# Patient Record
Sex: Female | Born: 1990 | Race: White | Hispanic: No | Marital: Single | Smoking: Current every day smoker
Health system: Southern US, Community
[De-identification: ages and names within clinical notes are randomized; demographics above are authoritative.]

## PROBLEM LIST (undated history)

## (undated) HISTORY — PX: NO PAST SURGERIES: SHX2092

---

## 2012-03-20 ENCOUNTER — Emergency Department: Payer: Self-pay | Admitting: Emergency Medicine

## 2012-03-28 ENCOUNTER — Emergency Department: Payer: Self-pay | Admitting: *Deleted

## 2012-03-28 LAB — COMPREHENSIVE METABOLIC PANEL
Albumin: 4 g/dL (ref 3.8–5.6)
Anion Gap: 8 (ref 7–16)
BUN: 8 mg/dL — ABNORMAL LOW (ref 9–21)
Chloride: 109 mmol/L — ABNORMAL HIGH (ref 97–107)
Co2: 26 mmol/L — ABNORMAL HIGH (ref 16–25)
Creatinine: 1.16 mg/dL (ref 0.60–1.30)
EGFR (African American): >60
EGFR (Non-African Amer.): >60
SGOT(AST): 24 U/L (ref 0–26)
Total Protein: 7.5 g/dL (ref 6.4–8.6)

## 2012-03-28 LAB — URINALYSIS, COMPLETE
Blood: NEGATIVE
Glucose,UR: NEGATIVE mg/dL (ref 0–75)
Leukocyte Esterase: NEGATIVE
Nitrite: NEGATIVE
Ph: 6 (ref 4.5–8.0)
Protein: NEGATIVE
Specific Gravity: 1.021 (ref 1.003–1.030)
WBC UR: 1 /HPF (ref 0–5)

## 2012-03-28 LAB — CBC
HGB: 14.3 g/dL (ref 12.0–16.0)
MCHC: 33.5 g/dL (ref 32.0–36.0)
MCV: 85 fL (ref 80–100)
Platelet: 255 10*3/uL (ref 150–440)
RBC: 5.01 10*6/uL (ref 3.80–5.20)
RDW: 13.6 % (ref 11.5–14.5)

## 2012-03-28 LAB — PREGNANCY, URINE: Pregnancy Test, Urine: NEGATIVE m[IU]/mL

## 2012-03-31 ENCOUNTER — Emergency Department: Payer: Self-pay | Admitting: *Deleted

## 2012-04-01 LAB — COMPREHENSIVE METABOLIC PANEL
Albumin: 4.2 g/dL (ref 3.8–5.6)
Alkaline Phosphatase: 94 U/L (ref 82–169)
Anion Gap: 6 — ABNORMAL LOW (ref 7–16)
BUN: 11 mg/dL (ref 9–21)
Bilirubin,Total: 0.3 mg/dL (ref 0.2–1.0)
Calcium, Total: 8.9 mg/dL — ABNORMAL LOW (ref 9.0–10.7)
Co2: 30 mmol/L — ABNORMAL HIGH (ref 16–25)
Creatinine: 1.12 mg/dL (ref 0.60–1.30)
EGFR (Non-African Amer.): >60
Potassium: 4.1 mmol/L (ref 3.3–4.7)
SGOT(AST): 21 U/L (ref 0–26)
Sodium: 142 mmol/L — ABNORMAL HIGH (ref 132–141)
Total Protein: 7.5 g/dL (ref 6.4–8.6)

## 2012-04-01 LAB — URINALYSIS, COMPLETE
Bacteria: NONE SEEN
Bilirubin,UR: NEGATIVE
Ketone: NEGATIVE
Ph: 8 (ref 4.5–8.0)
Protein: NEGATIVE
RBC,UR: 1 /HPF (ref 0–5)
Specific Gravity: 1.021 (ref 1.003–1.030)

## 2012-04-01 LAB — CBC
HCT: 42.6 % (ref 35.0–47.0)
MCHC: 32.9 g/dL (ref 32.0–36.0)
MCV: 86 fL (ref 80–100)
Platelet: 238 10*3/uL (ref 150–440)

## 2012-04-01 LAB — PREGNANCY, URINE: Pregnancy Test, Urine: NEGATIVE m[IU]/mL

## 2012-12-17 ENCOUNTER — Emergency Department: Payer: Self-pay | Admitting: Emergency Medicine

## 2012-12-17 LAB — URINALYSIS, COMPLETE
Glucose,UR: NEGATIVE mg/dL (ref 0–75)
Ketone: NEGATIVE
Leukocyte Esterase: NEGATIVE
Ph: 6 (ref 4.5–8.0)
Protein: 30
RBC,UR: 2 /HPF (ref 0–5)
Squamous Epithelial: 9

## 2012-12-17 LAB — CBC
HCT: 41.2 % (ref 35.0–47.0)
HGB: 14.1 g/dL (ref 12.0–16.0)
MCH: 29.3 pg (ref 26.0–34.0)
MCHC: 34.2 g/dL (ref 32.0–36.0)
MCV: 86 fL (ref 80–100)
Platelet: 240 10*3/uL (ref 150–440)
RDW: 14 % (ref 11.5–14.5)
WBC: 6.9 10*3/uL (ref 3.6–11.0)

## 2012-12-17 LAB — WET PREP, GENITAL

## 2012-12-19 ENCOUNTER — Emergency Department: Payer: Self-pay | Admitting: Unknown Physician Specialty

## 2012-12-19 LAB — HCG, QUANTITATIVE, PREGNANCY: Beta Hcg, Quant.: 23 m[IU]/mL — ABNORMAL HIGH

## 2013-01-27 ENCOUNTER — Emergency Department: Payer: Self-pay | Admitting: Emergency Medicine

## 2013-01-30 ENCOUNTER — Emergency Department: Payer: Self-pay | Admitting: Emergency Medicine

## 2014-04-04 IMAGING — US US OB < 14 WEEKS - US OB TV
1 series · 14 of 28 positions shown · non-contrast
Comparison: none

REASON FOR EXAM: preg, c ramping, bleeding
COMMENTS:

[Series 1: us ob < 14 weeks - us ob tv · 0.25mm/px · 14 of 65 slices shown]
[im 3/65]
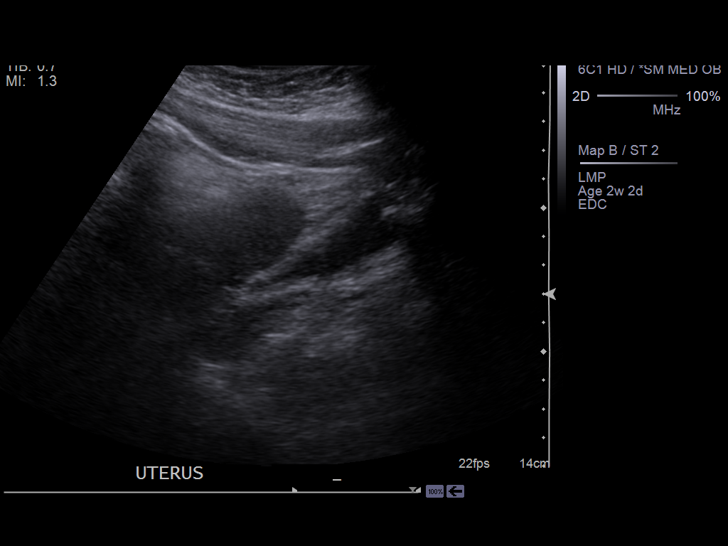
[im 8/65]
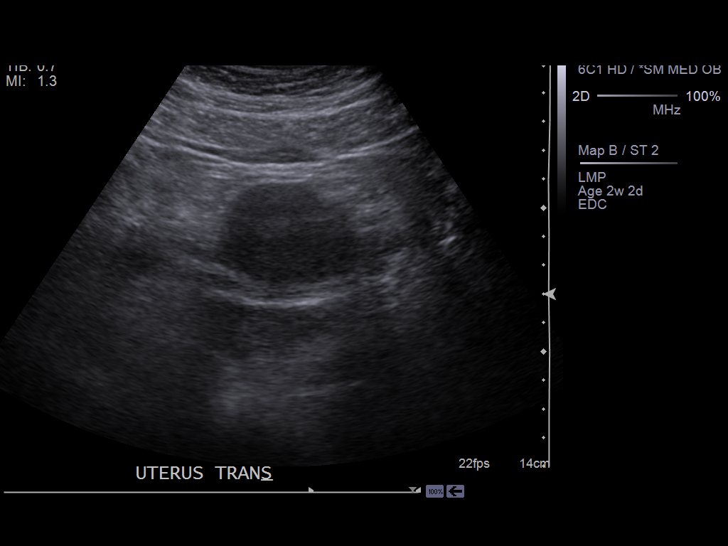
[im 12/65]
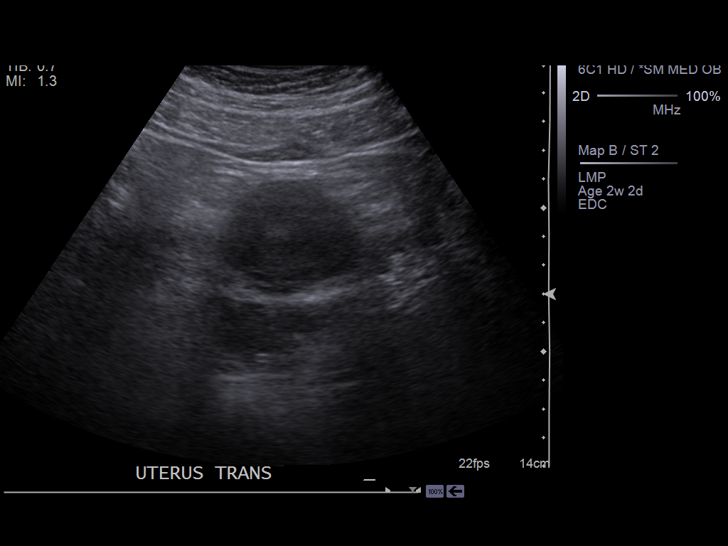
[im 17/65]
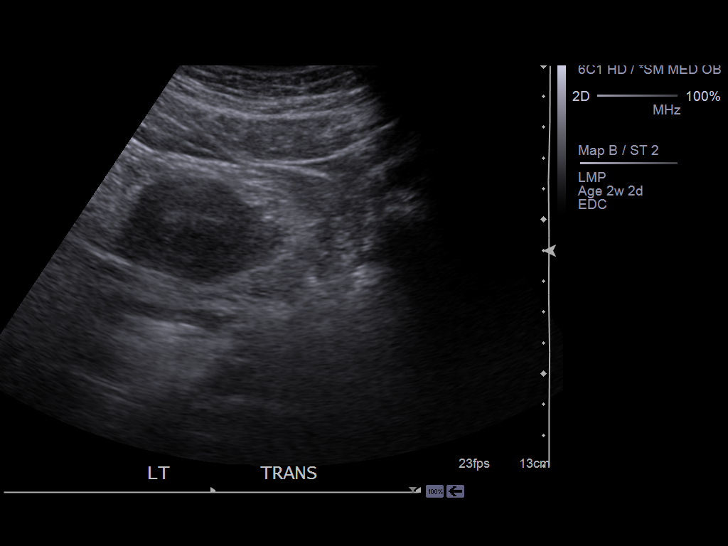
[im 22/65]
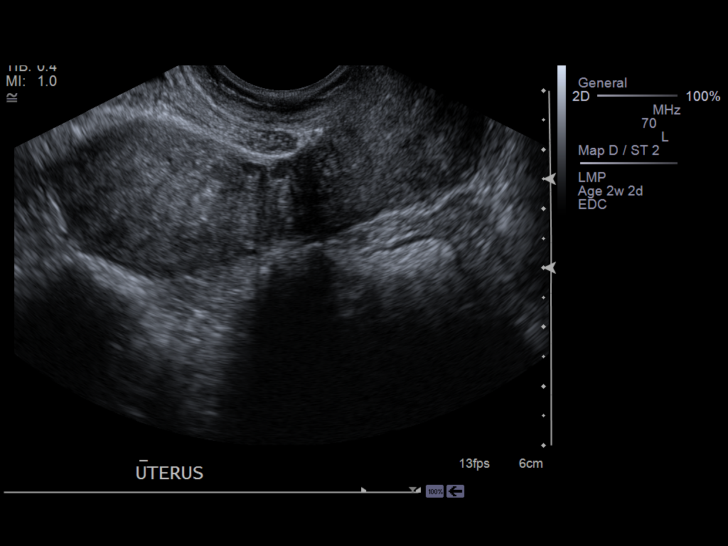
[im 27/65]
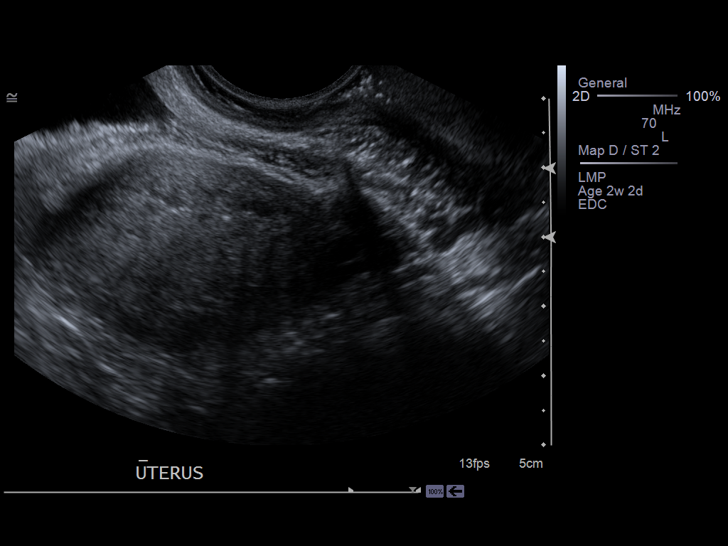
[im 31/65]
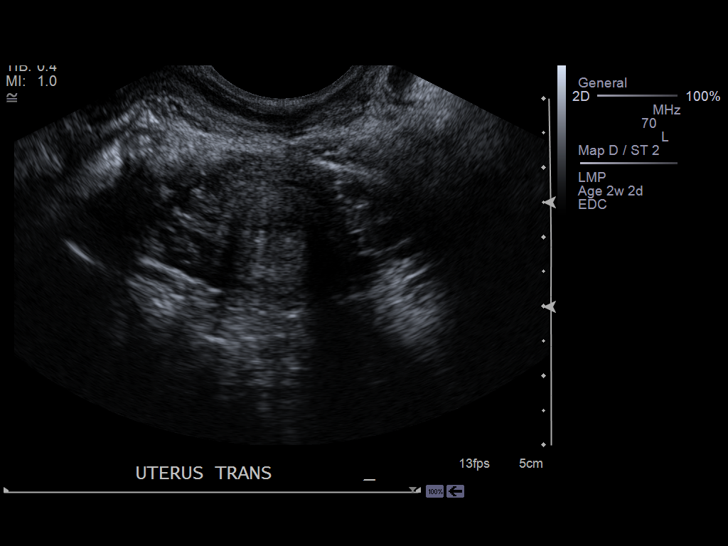
[im 36/65]
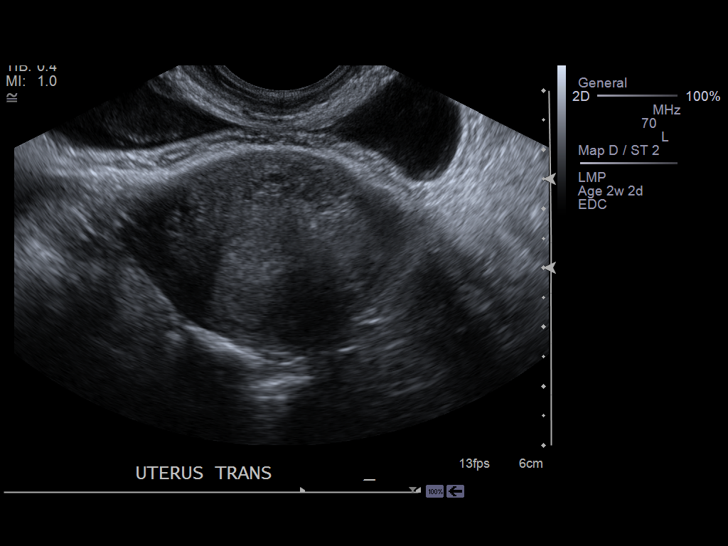
[im 41/65]
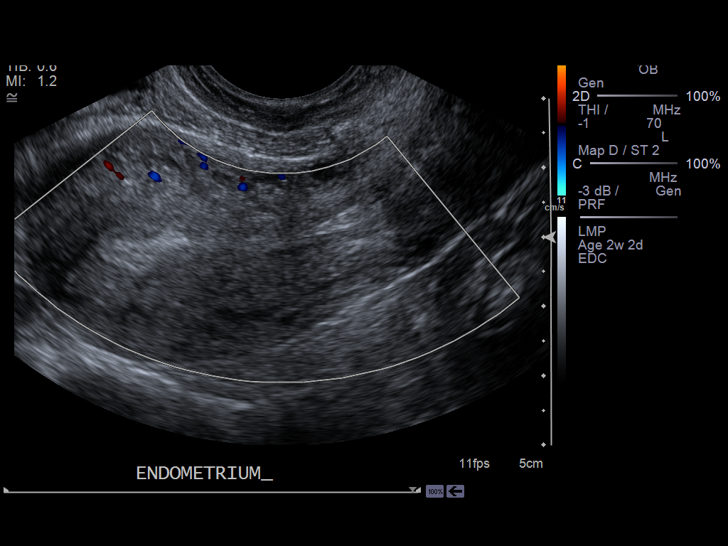
[im 46/65]
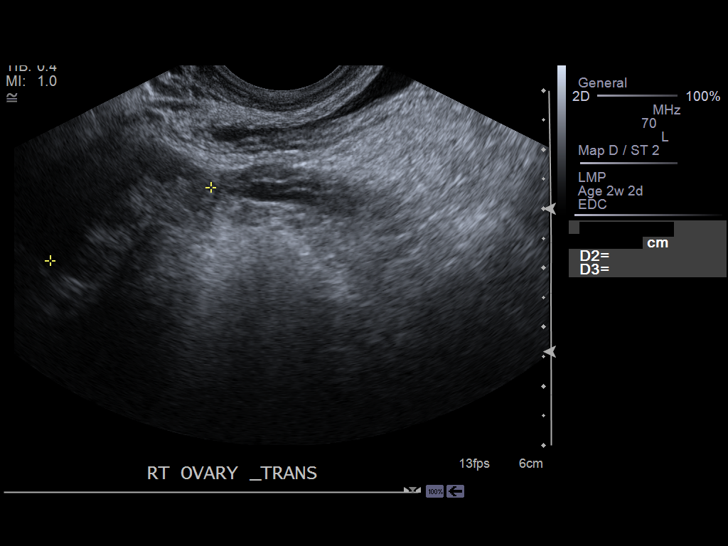
[im 50/65]
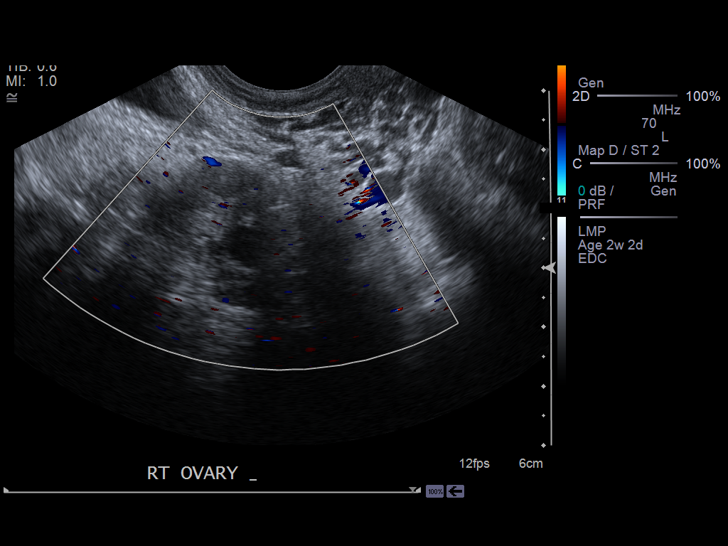
[im 55/65]
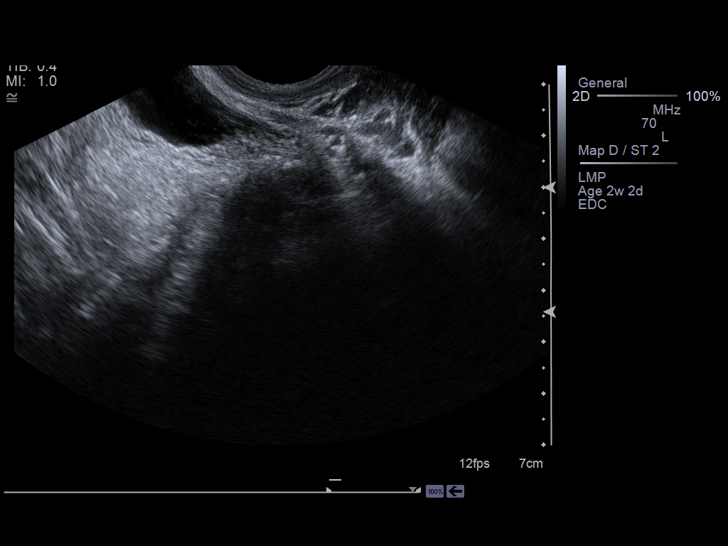
[im 60/65]
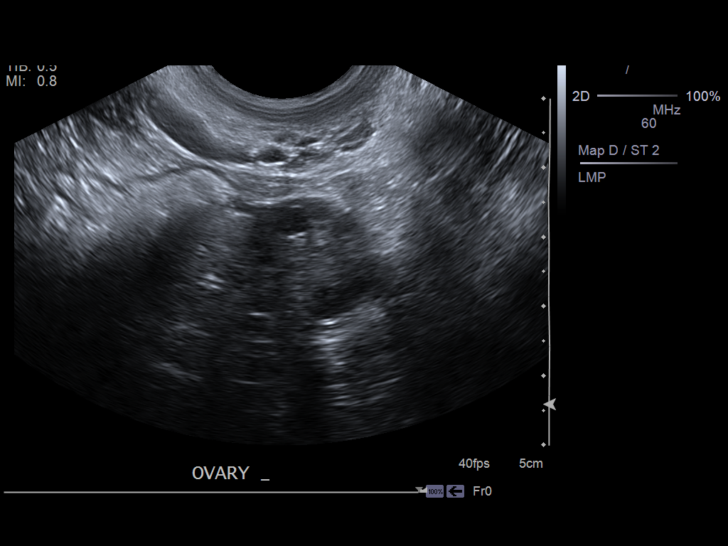
[im 65/65]
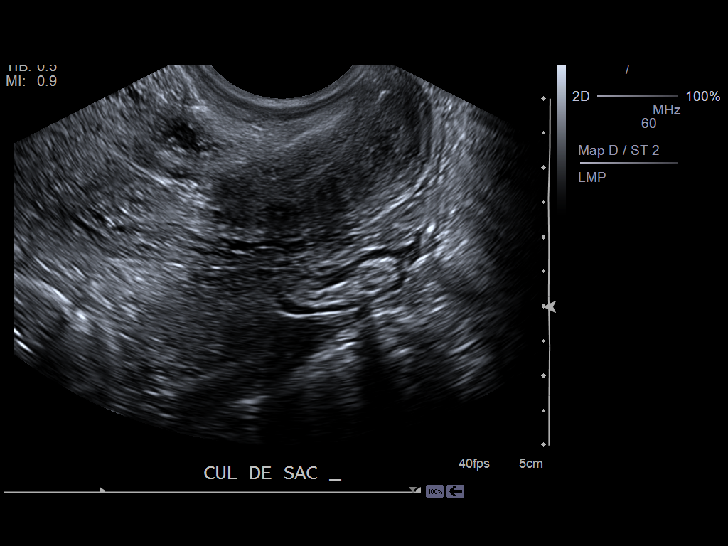

[14 of 28 positions shown; findings below may reference images not displayed]

PROCEDURE:     US  - US OB LESS THAN 14 WEEKS/W TRANS  - December 17, 2012 [DATE]

RESULT:     Transabdominal and transvaginal techniques were employed to
evaluate the pelvis.

The uterus measures 7.7 x 3.6 x 4.9 cm and exhibits normal echotexture. The
endometrial stripe measures 7.5 mm. No gestational sac or fluid in the
uterine cavity is demonstrated. There is no free fluid in the cul-de-sac.

The ovaries are normal in echotexture and vascularity. The right ovary
measures 3.3 x 2 x 3 centimeters the left ovary measures 2.2 x 1.9 x 1.4 cm.
No cystic or solid adnexal mass is demonstrated.
IMPRESSION: 1. There is no evidence of an IUP nor of an an ectopic pregnancy.
2. The appearance of the uterus and ovaries is within the limits of normal.

Serial beta-hCG determinations and followup ultrasound examinations may be
indicated.

[REDACTED]

## 2014-09-14 ENCOUNTER — Observation Stay: Payer: Self-pay

## 2014-09-15 ENCOUNTER — Inpatient Hospital Stay: Payer: Self-pay

## 2014-09-15 LAB — CBC WITH DIFFERENTIAL/PLATELET
BASOS ABS: 0 10*3/uL (ref 0.0–0.1)
BASOS PCT: 0.3 %
EOS PCT: 0.5 %
Eosinophil #: 0.1 10*3/uL (ref 0.0–0.7)
HCT: 38.9 % (ref 35.0–47.0)
HGB: 13 g/dL (ref 12.0–16.0)
Lymphocyte #: 0.9 10*3/uL — ABNORMAL LOW (ref 1.0–3.6)
Lymphocyte %: 6.5 %
MCH: 29.3 pg (ref 26.0–34.0)
MCHC: 33.4 g/dL (ref 32.0–36.0)
MCV: 88 fL (ref 80–100)
MONO ABS: 1.4 x10 3/mm — AB (ref 0.2–0.9)
MONOS PCT: 9.7 %
NEUTROS ABS: 11.7 10*3/uL — AB (ref 1.4–6.5)
Neutrophil %: 83 %
PLATELETS: 159 10*3/uL (ref 150–440)
RBC: 4.44 10*6/uL (ref 3.80–5.20)
RDW: 13.7 % (ref 11.5–14.5)
WBC: 14.1 10*3/uL — AB (ref 3.6–11.0)

## 2014-09-15 LAB — GC/CHLAMYDIA PROBE AMP

## 2014-09-18 LAB — HEMATOCRIT: HCT: 34.2 % — ABNORMAL LOW (ref 35.0–47.0)

## 2015-02-14 NOTE — H&P (Signed)
L&D Evaluation:  History:  HPI 24 year old G2 P0010 with EDC=09/07/2014 by LMP=12/01/2013 presents for a NST and AFI at [redacted] weeks gestation. She was scheduled for an IOL today, which had to be postponed till tomorrow due to busy L&D unit. Baby has been moving well and has felt some mild irregular ctxs. PNC begun in [place] then transferred to [facility] at 34 weeks. Her PNC course remarkable for Trichimonas, obesity, and receiving Rhogam 08/05/2014.  LABS : A negative, RI, VNI,GBS negative.   Presents with postdates for antepartum testing   Patient's Medical History Fx right arm   Patient's Surgical History none   Medications Pre Natal Vitamins   Allergies ASA   Social History tobacco  former   ROS:  ROS see HPI   Exam:  Vital Signs stable  128/80   General no apparent distress   Mental Status clear   Abdomen gravid, non-tender   Estimated Fetal Weight Average for gestational age   Fetal Position cephalic   Mebranes Intact, AFI=11.28cm   FHT normal rate with no decels, 140s with accels to 170s to 180   Ucx irregular, mild   Impression:  Impression IUP at 41 weeks with reactive NST and normal AFI   Plan:  Plan DC home . Call in AM at 0800 for bed availability. Plan Cervidil in AM.   Electronic Signatures: Trinna BalloonGutierrez, Arcadia Gorgas L (CNM)  (Signed 09-Dec-15 23:58)  Authored: L&D Evaluation   Last Updated: 09-Dec-15 23:58 by Trinna BalloonGutierrez, Madie Cahn L (CNM)

## 2015-02-14 NOTE — H&P (Signed)
L&D Evaluation:  History Expanded:  HPI 24 year old G2 P0010 with EDC=09/07/2014 by LMP=12/01/2013 presents for IOL for postterm pregnancy. AFI and NST were Normal last night. Denies regular ctx, LOF, VB or decreased FM. PNC begun in [place] then transferred to [facility] at 34 weeks. Her PNC course remarkable for Trichimonas with negative TOC, obesity, and receiving Rhogam 08/05/2014.  LABS : A negative, RI, VNI,GBS negative.   Blood Type (Maternal) A negative   Group B Strep Results Maternal (Result >5wks must be treated as unknown) negative   Maternal HIV Negative   Maternal Syphilis Ab Nonreactive   Maternal Varicella Non-Immune   Rubella Results (Maternal) immune   Presents with postdates IOL   Patient's Medical History Fx right arm, Trich   Patient's Surgical History none   Medications Pre Natal Vitamins   Allergies ASA (pt's father is allergic)   Social History tobacco  former   ROS:  ROS see HPI   Exam:  Vital Signs stable   General no apparent distress   Mental Status clear   Abdomen gravid, non-tender   Estimated Fetal Weight Average for gestational age   Fetal Position cephalic   Pelvic no external lesions, 1.5/80/0   Mebranes Intact   FHT normal rate with no decels, baseline 135, mod variability, + accels   Impression:  Impression IOL   Plan:  Comments Reviewed risks of IOL including tachysystole, fetal intolerance to labor, or need for cesarean delivery. Pt agrees to plan for IOL. RN to place cervidil intravaginally.   Electronic Signatures: Kharee Lesesne, Marta Lamasamara K (CNM)  (Signed 10-Dec-15 11:36)  Authored: L&D Evaluation   Last Updated: 10-Dec-15 11:36 by Vella KohlerBrothers, Sohrab Keelan K (CNM)

## 2015-05-26 DIAGNOSIS — R079 Chest pain, unspecified: Secondary | ICD-10-CM | POA: Insufficient documentation

## 2015-05-27 ENCOUNTER — Emergency Department: Payer: Medicaid Other

## 2015-05-27 ENCOUNTER — Other Ambulatory Visit: Payer: Self-pay

## 2015-05-27 ENCOUNTER — Encounter: Payer: Self-pay | Admitting: Emergency Medicine

## 2015-05-27 ENCOUNTER — Emergency Department
Admission: EM | Admit: 2015-05-27 | Discharge: 2015-05-27 | Disposition: A | Discharge status: Home / Self Care | Payer: Medicaid Other | Attending: Emergency Medicine | Admitting: Emergency Medicine

## 2015-05-27 DIAGNOSIS — R079 Chest pain, unspecified: Secondary | ICD-10-CM

## 2015-05-27 NOTE — Discharge Instructions (Signed)

## 2015-05-27 NOTE — ED Provider Notes (Signed)
[facility] Emergency Department Provider Note  ____________________________________________  Time seen: Approximately 225 AM  I have reviewed the triage vital signs and the nursing notes.   HISTORY  Chief Complaint Pleurisy    HPI Erin Levine is a 24 y.o. female without any pertinent medical problems who presents with upper chest pain over the past 3 days. 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 Ever since then she's been having aching chest pain was nonradiating to the upper chest. She says it is worse with deep breathing. No associated shortness of breath, nausea vomiting or diaphoresis. Has a progestin only implanted birth control.   History reviewed. No pertinent past medical history.  There are no active problems to display for this patient.   History reviewed. No pertinent past surgical history.  No current outpatient prescriptions on file.  Allergies Review of patient's allergies indicates no known allergies.  No family history on file.  Social History Social History  Substance Use Topics  . Smoking status: Unknown If Ever Smoked  . Smokeless tobacco: None  . Alcohol Use: No    Review of Systems Constitutional: No fever/chills Eyes: No visual changes. ENT: No sore throat. Cardiovascular: Denies chest pain. Respiratory: Denies shortness of breath. Gastrointestinal: No abdominal pain.  No nausea, no vomiting.  No diarrhea.  No constipation. Genitourinary: Negative for dysuria. Musculoskeletal: Negative for back pain. Skin: Negative for rash. Neurological: Negative for headaches, focal weakness or numbness.  10-point ROS otherwise negative.  ____________________________________________   PHYSICAL EXAM:  VITAL SIGNS: ED Triage Vitals  Enc Vitals Group     BP 05/27/15 0012 130/61 mmHg     Pulse Rate 05/27/15 0006 83     Resp 05/27/15 0006 18     Temp 05/27/15 0006 98.5  F (36.9 C)     Temp Source 05/27/15 0006 Oral     SpO2 05/27/15 0006 99 %     Weight 05/27/15 0006 180 lb (81.647 kg)     Height 05/27/15 0006  (1.753 m)     Head Cir --      Peak Flow --      Pain Score 05/27/15 0007 8     Pain Loc --      Pain Edu? --      Excl. in GC? --     Constitutional: Alert and oriented. Well appearing and in no acute distress. Eyes: Conjunctivae are normal. PERRL. EOMI. Head: Atraumatic. Nose: No congestion/rhinnorhea. Mouth/Throat: Mucous membranes are moist.  Oropharynx non-erythematous. Neck: No stridor.   Cardiovascular: Normal rate, regular rhythm. Grossly normal heart sounds.  Good peripheral circulation. Chest pain reproducible with palpation. Respiratory: Normal respiratory effort.  No retractions. Lungs CTAB. Gastrointestinal: Soft and nontender. No distention. No abdominal bruits. No CVA tenderness. Musculoskeletal: No lower extremity tenderness nor edema.  No joint effusions. Neurologic:  Normal speech and language. No gross focal neurologic deficits are appreciated. No gait instability. Skin:  Skin is warm, dry and intact. No rash noted. Psychiatric: Mood and affect are normal. Speech and behavior are normal.  ____________________________________________   LABS (all labs ordered are listed, but only abnormal results are displayed)  Labs Reviewed - No data to display ____________________________________________  EKG  ED ECG REPORT I, Somalia Segler,  Teena Irani, the attending physician, personally viewed and interpreted this ECG.   Date: 05/27/2015  EKG Time: 00 23  Rate: 70  Rhythm: normal EKG, normal sinus rhythm  Axis: Normal axis  Intervals:none  ST&T Change: No ST elevations or depressions. No abnormal T-wave inversions.  ____________________________________________  RADIOLOGY  No acute cardiopulmonary disease on the chest x-ray. I personally reviewed this  image. ____________________________________________   PROCEDURES    ____________________________________________   INITIAL IMPRESSION / ASSESSMENT AND PLAN / ED COURSE  Pertinent labs & imaging results that were available during my care of the patient were reviewed by me and considered in my medical decision making (see chart for details).  Patient is PERC negative.  Likely with musculoskeletal chest pain. We'll try over-the-counter pain relief such as ibuprofen or Aleve. We'll also try icy hot or Aspercreme for relief. ____________________________________________   FINAL CLINICAL IMPRESSION(S) / ED DIAGNOSES  Acute chest pain. Initial visit.    Myrna Blazer, MD 05/27/15 [phone]

## 2015-05-27 NOTE — ED Notes (Signed)
Patient with no complaints at this time. Respirations even and unlabored. Skin warm/dry. Discharge instructions reviewed with patient at this time. Patient given opportunity to voice concerns/ask questions. Patient discharged at this time and left Emergency Department with steady gait.   

## 2015-05-27 NOTE — ED Notes (Signed)
Patient reports left upper chest discomfort since Wednesday.  Patient reports pain comes and goes and that pain increases with deep breathing

## 2016-09-10 IMAGING — CR DG CHEST 2V
1 series · 2 of 2 positions shown · non-contrast
Comparison: None.

CLINICAL DATA: Acute onset of mid chest pain and shortness of
breath. Initial encounter.

EXAM:
CHEST  2 VIEW

[Series 1: dg chest 2 view · 0.14mm/px · 2 of 2 slices shown]
[im 1/2]
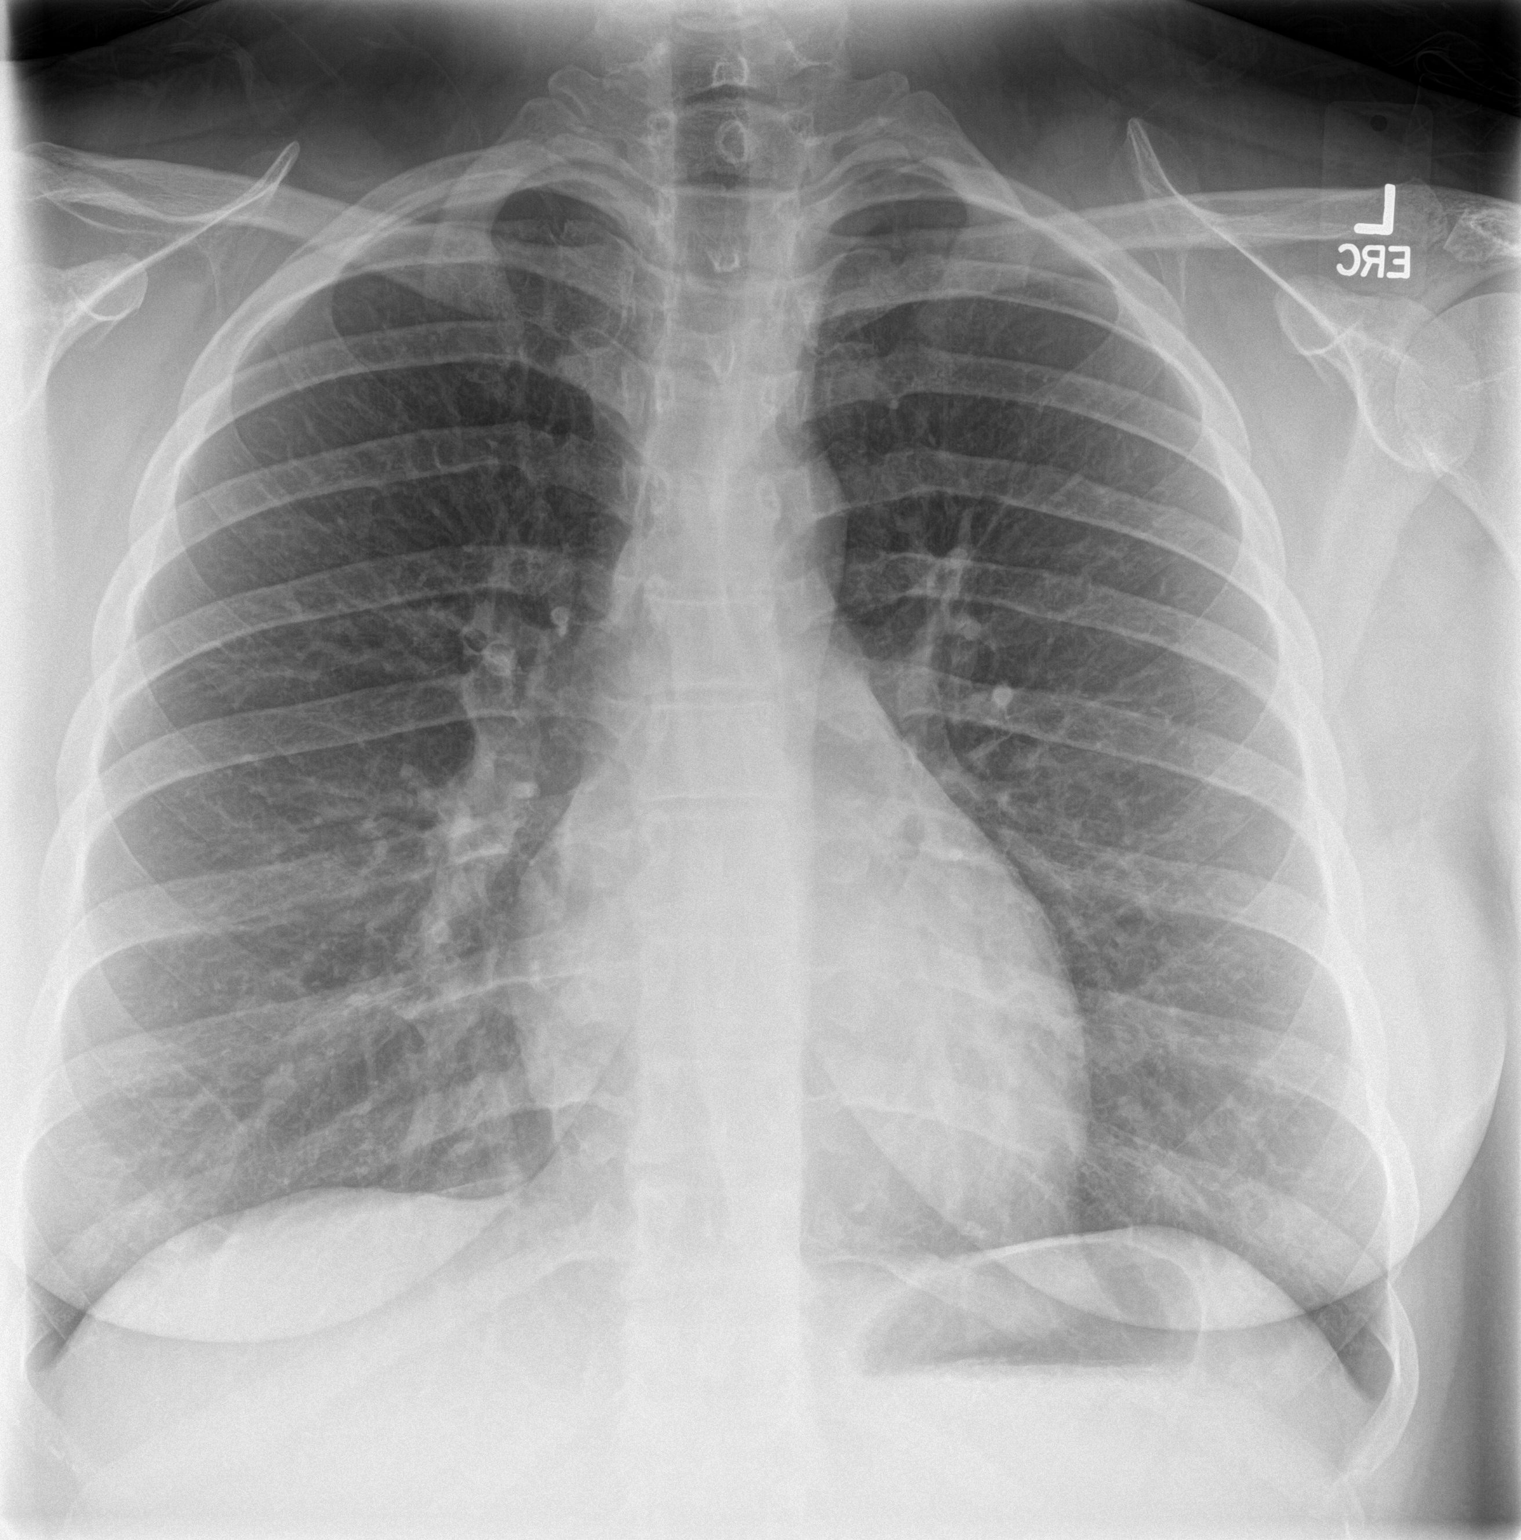
[im 2/2]
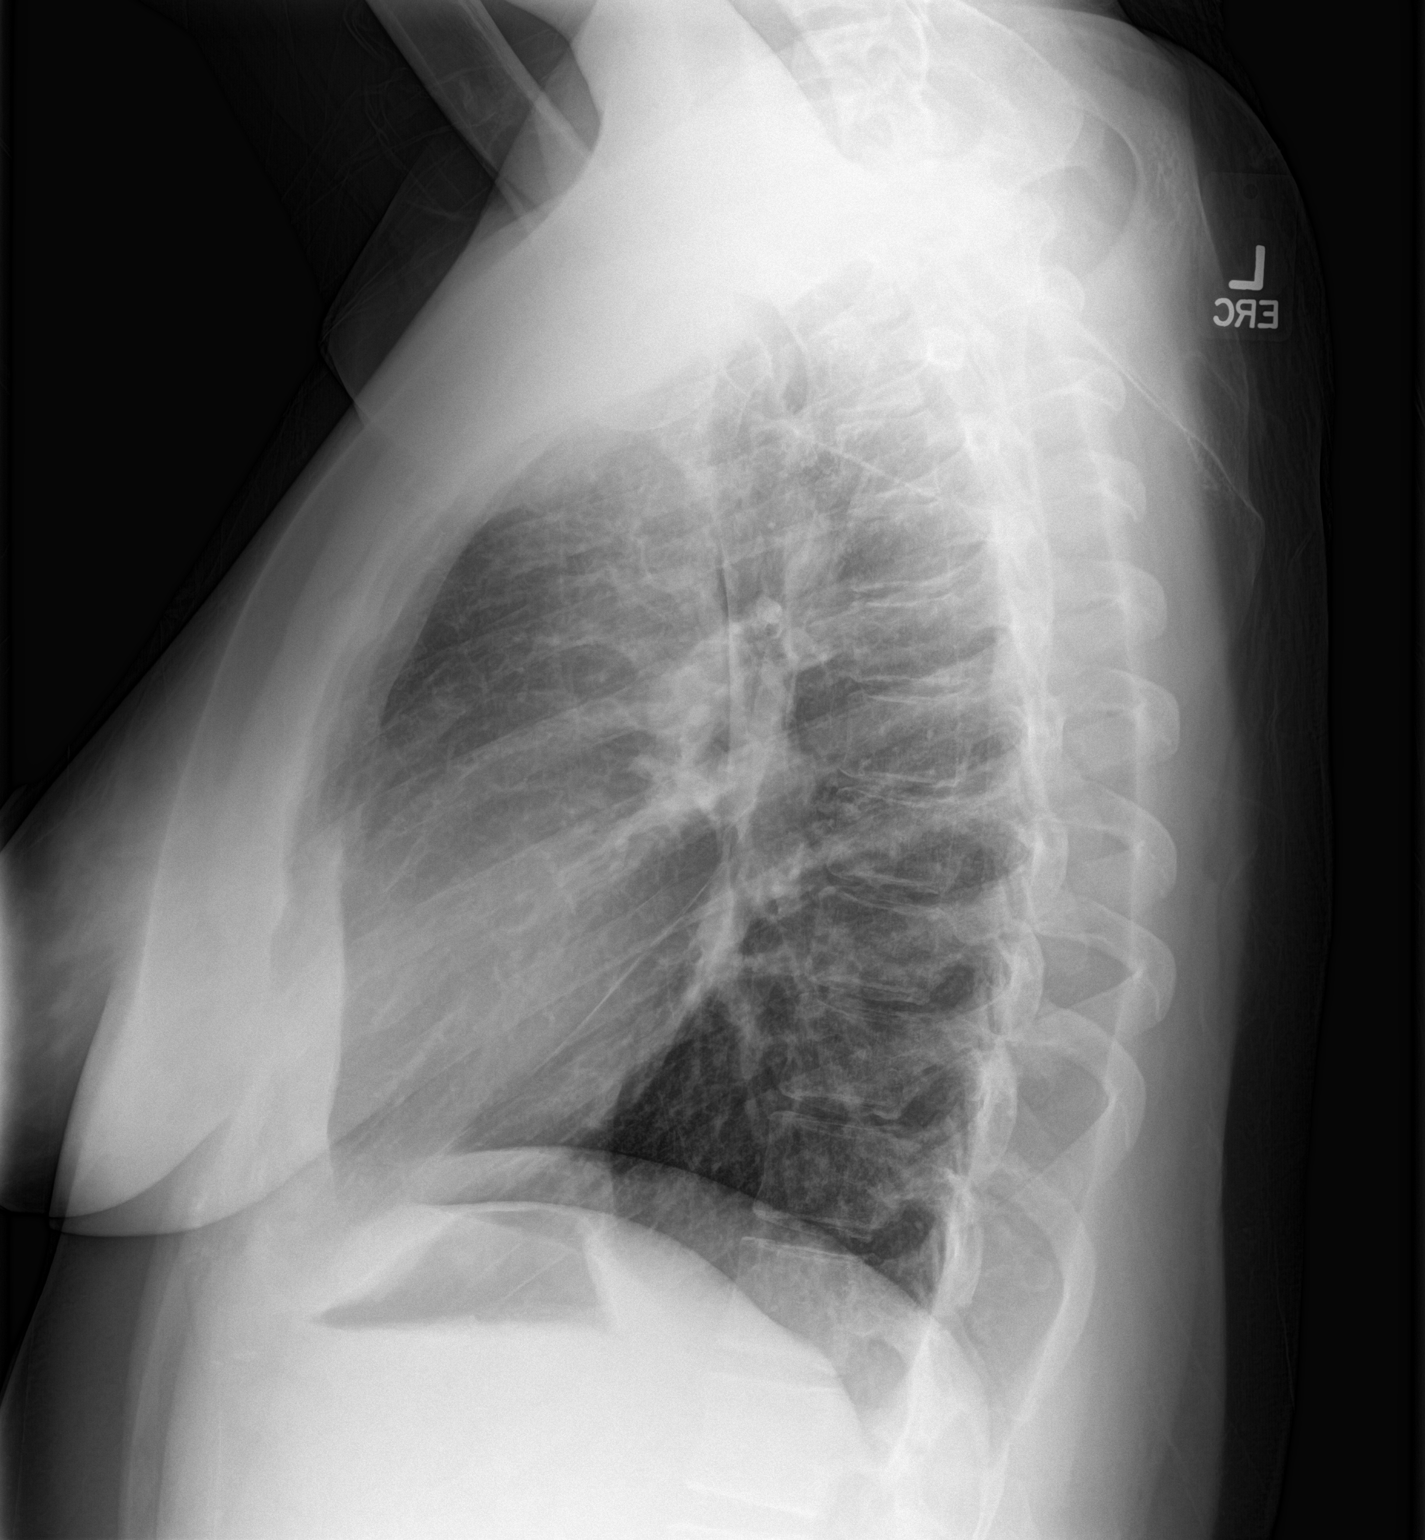

[2 of 2 positions shown; findings below may reference images not displayed]

FINDINGS: The lungs are well-aerated and clear. There is no evidence of focal
opacification, pleural effusion or pneumothorax.

The heart is normal in size; the mediastinal contour is within
normal limits. No acute osseous abnormalities are seen.
IMPRESSION: No acute cardiopulmonary process seen.

## 2016-12-24 ENCOUNTER — Telehealth: Payer: Self-pay

## 2016-12-24 NOTE — Telephone Encounter (Signed)
Pt called requesting a call back. [phone].

## 2016-12-24 NOTE — Telephone Encounter (Signed)
Pt had nexplanon removed 10/08/16 and had a normal cycle starting on 11/20/16. She is concerned because she has not started her cycle this month and not currently using birth control other than condoms. Pt has had negative UPT. Advised it may take a few months to become regular again after removing nexplanon and transferred pt to front desk to schedule birth control conference due to pt does not desire pregnancy at this time.

## 2016-12-30 ENCOUNTER — Encounter: Payer: Self-pay | Admitting: Obstetrics and Gynecology

## 2016-12-30 ENCOUNTER — Ambulatory Visit (INDEPENDENT_AMBULATORY_CARE_PROVIDER_SITE_OTHER): Payer: Medicaid Other | Admitting: Obstetrics and Gynecology

## 2016-12-30 VITALS — BP 104/64 | HR 78 | Ht 69.0 in | Wt 218.0 lb

## 2016-12-30 DIAGNOSIS — Z30011 Encounter for initial prescription of contraceptive pills: Secondary | ICD-10-CM

## 2016-12-30 MED ORDER — LEVONORGESTREL-ETHINYL ESTRAD 0.1-20 MG-MCG PO TABS: 1.0000 | ORAL_TABLET | Freq: Every day | ORAL | 2 refills | Status: DC

## 2016-12-30 NOTE — Progress Notes (Signed)
    HPI:      Ms. Erin FilaKathryn Levine is a 26 y.o. No obstetric history on file. who LMP was Patient's last menstrual period was 12/26/2016 (exact date).[facility] She normally has monthly menses, lasting 6-7 days without BTB. She has dysmen without relief with NSAIDs. She had the nexplanon for 2 yrs, didn't like it, and had it removed 1/18. She would like to try OCPs. No hx of migraines/HTN/SLE/clotting disorders.   She is past due for her annual.  History reviewed. No pertinent past medical history.  Past Surgical History:  Procedure Laterality Date  . NO PAST SURGERIES      Family History  Problem Relation Age of Onset  . Diabetes Father   . Diabetes Maternal Grandmother      ROS:  Review of Systems  Constitutional: Negative for fever, malaise/fatigue and weight loss.  Gastrointestinal: Negative for blood in stool, constipation, diarrhea, nausea and vomiting.  Genitourinary: Negative for dysuria, flank pain, frequency, hematuria and urgency.  Musculoskeletal: Negative for back pain.  Skin: Negative for itching and rash.    Objective: BP 104/64   Pulse 78   Ht 5\' 9"  (1.753 m)   Wt 218 lb (98.9 kg)   LMP 12/26/2016 (Exact Date)   BMI 32.19 kg/m    OBGyn Exam No exam done today.  Assessment/Plan: Encounter for initial prescription of contraceptive pills - OCP start today. Condoms for 1 mo. Rx eRxd. RTO in 2 months for annual and f/u. - Plan: levonorgestrel-ethinyl estradiol (AVIANE) 0.1-20 MG-MCG tablet    Meds ordered this encounter  Medications  . levonorgestrel-ethinyl estradiol (AVIANE) 0.1-20 MG-MCG tablet    Sig: Take 1 tablet by mouth daily.    Dispense:  28 tablet    Refill:  2     F/U  Return in about 2 months (around 03/01/2017) for annual.  Alicia B. Copland, PA-C 12/30/2016 2:47 PM

## 2017-03-05 ENCOUNTER — Other Ambulatory Visit: Payer: Self-pay

## 2017-03-05 DIAGNOSIS — Z30011 Encounter for initial prescription of contraceptive pills: Secondary | ICD-10-CM

## 2017-03-05 MED ORDER — LEVONORGESTREL-ETHINYL ESTRAD 0.1-20 MG-MCG PO TABS: 1.0000 | ORAL_TABLET | Freq: Every day | ORAL | 0 refills | Status: DC

## 2017-03-05 NOTE — Telephone Encounter (Signed)
Pt called for refill of bcp.  She is out of town and out of pills and she cancelled annual for tomorrow.  One pack verbal order given to Doree Barthelebecca, [facility]  Pt aware and will call back to sched annual.

## 2017-03-05 NOTE — Telephone Encounter (Signed)
This encounter was created in error - please disregard.

## 2017-03-06 ENCOUNTER — Ambulatory Visit: Payer: Medicaid Other | Admitting: Obstetrics and Gynecology

## 2017-03-17 ENCOUNTER — Telehealth: Payer: Self-pay

## 2017-03-17 DIAGNOSIS — Z30011 Encounter for initial prescription of contraceptive pills: Secondary | ICD-10-CM

## 2017-03-17 MED ORDER — LEVONORGESTREL-ETHINYL ESTRAD 0.1-20 MG-MCG PO TABS: 1.0000 | ORAL_TABLET | Freq: Every day | ORAL | 1 refills | Status: DC

## 2017-03-17 NOTE — Telephone Encounter (Signed)
Pt calling for refill of bcp to get her to appt on 7/30.  Has eight pills left.  Pt aware refills sent eRx.

## 2017-04-28 ENCOUNTER — Telehealth: Payer: Self-pay

## 2017-04-28 MED ORDER — NORETHIN ACE-ETH ESTRAD-FE 1-20 MG-MCG PO TABS: 1.0000 | ORAL_TABLET | Freq: Every day | ORAL | 0 refills | Status: DC

## 2017-04-28 NOTE — Addendum Note (Signed)
Addended by: Althea GrimmerOPLAND, Sherral Dirocco B on: 04/28/2017 03:15 PM   Modules accepted: Orders

## 2017-04-28 NOTE — Telephone Encounter (Signed)
Will change OCPs when due to start new pill pack. Rx junel 1/20 eRxd. F/u in 1 wk at [facility]. RN to notify pt.

## 2017-04-28 NOTE — Telephone Encounter (Signed)
Pt has bled constantly since starting aviane following removal of nexplanon in March.  She is not bleeding heavy just constant. Pt has appt ON 05/05/17 for annual. Precautions given regarding heavy bleeding. Please advise any other advice

## 2017-05-05 ENCOUNTER — Encounter: Payer: Self-pay | Admitting: Obstetrics and Gynecology

## 2017-05-05 ENCOUNTER — Ambulatory Visit (INDEPENDENT_AMBULATORY_CARE_PROVIDER_SITE_OTHER): Payer: Medicaid Other | Admitting: Obstetrics and Gynecology

## 2017-05-05 VITALS — BP 120/80 | HR 81 | Ht 69.0 in | Wt 222.0 lb

## 2017-05-05 DIAGNOSIS — Z3041 Encounter for surveillance of contraceptive pills: Secondary | ICD-10-CM

## 2017-05-05 DIAGNOSIS — Z113 Encounter for screening for infections with a predominantly sexual mode of transmission: Secondary | ICD-10-CM

## 2017-05-05 DIAGNOSIS — Z01419 Encounter for gynecological examination (general) (routine) without abnormal findings: Secondary | ICD-10-CM

## 2017-05-05 DIAGNOSIS — N921 Excessive and frequent menstruation with irregular cycle: Secondary | ICD-10-CM

## 2017-05-05 DIAGNOSIS — Z716 Tobacco abuse counseling: Secondary | ICD-10-CM

## 2017-05-05 DIAGNOSIS — F17209 Nicotine dependence, unspecified, with unspecified nicotine-induced disorders: Secondary | ICD-10-CM

## 2017-05-05 DIAGNOSIS — Z309 Encounter for contraceptive management, unspecified: Secondary | ICD-10-CM

## 2017-05-05 DIAGNOSIS — Z124 Encounter for screening for malignant neoplasm of cervix: Secondary | ICD-10-CM

## 2017-05-05 MED ORDER — NORETHIN ACE-ETH ESTRAD-FE 1-20 MG-MCG PO TABS: 1.0000 | ORAL_TABLET | Freq: Every day | ORAL | 12 refills | Status: DC

## 2017-05-05 NOTE — Progress Notes (Signed)
Chief Complaint  Patient presents with  . Gynecologic Exam     HPI:      Erin Levine is a [age] y.o. G1P1001 who LMP was No LMP recorded., presents today for her annual examination.  Her menses are regular every 28-30 days, lasting 7 days.  Dysmenorrhea none. She does have intermenstrual bleeding without late/missed pills on aviane, started 4/18. Pt has new Rx to start when completes current aviane pill pack. No other side effects.   Sex activity: single partner, contraception - OCP (estrogen/progesterone).  Last Pap: not recent; Hx of STDs: none  There is no FH of breast cancer. There is no FH of ovarian cancer. The patient does not do self-breast exams.  Tobacco use: The patient currently smokes 1/2 packs of cigarettes per day for the past many years. Alcohol use: none Exercise: moderately active  She does get adequate calcium and Vitamin D in her diet.    History reviewed. No pertinent past medical history.  Past Surgical History:  Procedure Laterality Date  . NO PAST SURGERIES      Family History  Problem Relation Age of Onset  . Diabetes Father   . Diabetes Maternal Grandmother     Social History   Social History  . Marital status: Single    Spouse name: N/A  . Number of children: N/A  . Years of education: N/A   Occupational History  . Not on file.   Social History Main Topics  . Smoking status: Current Every Day Smoker    Packs/day: 0.50  . Smokeless tobacco: Never Used  . Alcohol use Yes     Comment: LIGHT  . Drug use: No  . Sexual activity: Yes    Birth control/ protection: Condom   Other Topics Concern  . Not on file   Social History Narrative  . No narrative on file     Current Outpatient Prescriptions:  .  norethindrone-ethinyl estradiol (JUNEL FE 1/20) 1-20 MG-MCG tablet, Take 1 tablet by mouth daily., Disp: 28 tablet, Rfl: 12  ROS:  Review of Systems  Constitutional: Negative for fatigue, fever and unexpected weight change.    Respiratory: Negative for cough, shortness of breath and wheezing.   Cardiovascular: Negative for chest pain, palpitations and leg swelling.  Gastrointestinal: Negative for blood in stool, constipation, diarrhea, nausea and vomiting.  Endocrine: Negative for cold intolerance, heat intolerance and polyuria.  Genitourinary: Positive for vaginal bleeding. Negative for dyspareunia, dysuria, flank pain, frequency, genital sores, hematuria, menstrual problem, pelvic pain, urgency, vaginal discharge and vaginal pain.  Musculoskeletal: Negative for back pain, joint swelling and myalgias.  Skin: Negative for rash.  Neurological: Negative for dizziness, syncope, light-headedness, numbness and headaches.  Hematological: Negative for adenopathy.  Psychiatric/Behavioral: Negative for agitation, confusion, sleep disturbance and suicidal ideas. The patient is not nervous/anxious.      Objective: BP 120/80   Pulse 81   Ht 5\' 9"  (1.753 m)   Wt 222 lb (100.7 kg)   BMI 32.78 kg/m    Physical Exam  Constitutional: She is oriented to person, place, and time. She appears well-developed and well-nourished.  Genitourinary: Vagina normal and uterus normal. There is no rash or tenderness on the right labia. There is no rash or tenderness on the left labia. No erythema or tenderness in the vagina. No vaginal discharge found. Right adnexum does not display mass and does not display tenderness. Left adnexum does not display mass and does not display tenderness. Cervix does not exhibit motion  tenderness or polyp. Uterus is not enlarged or tender.  Neck: Normal range of motion. No thyromegaly present.  Cardiovascular: Normal rate, regular rhythm and normal heart sounds.   No murmur heard. Pulmonary/Chest: Effort normal and breath sounds normal. Right breast exhibits no mass, no nipple discharge, no skin change and no tenderness. Left breast exhibits no mass, no nipple discharge, no skin change and no tenderness.   Abdominal: Soft. There is no tenderness. There is no guarding.  Musculoskeletal: Normal range of motion.  Neurological: She is alert and oriented to person, place, and time. No cranial nerve deficit.  Psychiatric: She has a normal mood and affect. Her behavior is normal.  Vitals reviewed.    Assessment/Plan: Encounter for annual routine gynecological examination  Cervical cancer screening - Plan: IGP,CtNgTv,rfx Aptima HPV ASCU  Screening for STD (sexually transmitted disease) - Plan: IGP,CtNgTv,rfx Aptima HPV ASCU  Encounter for surveillance of contraceptive pills - Plan: norethindrone-ethinyl estradiol (JUNEL FE 1/20) 1-20 MG-MCG tablet  Breakthrough bleeding on birth control pills - Pt has different pills to try next month. Rx eRxd. F/u prn sx.  Tobacco use disorder, continuous - Pt not ready to quit tobacco.  Tobacco abuse counseling  Meds ordered this encounter  Medications  . norethindrone-ethinyl estradiol (JUNEL FE 1/20) 1-20 MG-MCG tablet    Sig: Take 1 tablet by mouth daily.    Dispense:  28 tablet    Refill:  12              GYN counsel use and side effects of OCP's, adequate intake of calcium and vitamin D     F/U  Return in about 1 year (around 05/05/2018).  Marcellas Marchant B. Raymie Trani, PA-C 05/05/2017 1:57 PM

## 2017-05-10 LAB — IGP,CTNGTV,RFX APTIMA HPV ASCU
CHLAMYDIA, NUC. ACID AMP: NEGATIVE
GONOCOCCUS, NUC. ACID AMP: NEGATIVE
PAP Smear Comment: 0
TRICH VAG BY NAA: NEGATIVE

## 2017-05-10 LAB — HPV APTIMA: HPV Aptima: NEGATIVE

## 2017-05-12 ENCOUNTER — Encounter: Payer: Self-pay | Admitting: Obstetrics and Gynecology

## 2017-06-26 ENCOUNTER — Telehealth: Payer: Self-pay | Admitting: Obstetrics and Gynecology

## 2018-02-13 ENCOUNTER — Other Ambulatory Visit: Payer: Self-pay | Admitting: Obstetrics and Gynecology

## 2018-02-13 DIAGNOSIS — Z3041 Encounter for surveillance of contraceptive pills: Secondary | ICD-10-CM

## 2018-02-16 ENCOUNTER — Other Ambulatory Visit: Payer: Self-pay | Admitting: Obstetrics and Gynecology

## 2018-02-16 DIAGNOSIS — Z3041 Encounter for surveillance of contraceptive pills: Secondary | ICD-10-CM

## 2018-04-13 ENCOUNTER — Other Ambulatory Visit: Payer: Self-pay | Admitting: Obstetrics and Gynecology

## 2018-04-13 DIAGNOSIS — Z3041 Encounter for surveillance of contraceptive pills: Secondary | ICD-10-CM

## 2018-04-13 NOTE — Telephone Encounter (Signed)
Pls let pt know she needs to sched annual. Thx.

## 2018-04-13 NOTE — Telephone Encounter (Signed)
Left vm for pt to call back and schedule annual

## 2018-04-13 NOTE — Telephone Encounter (Signed)
Pt reports she is going to [place] for 1 month & wanting to p/u rx before she leaves.

## 2018-04-13 NOTE — Telephone Encounter (Addendum)
Spoke w/pt. Notified [facility] had already rf #84 (3 mo supply) which should be able to be picked up all at once unless her insurance doesn't cover it that way. She would need to contact insurance if this is the case. Also, If she wants to p/u 1 mo & needs another pack prior to returning, she can get filled at the [facility] [place]. Advised patient to research & notify pharamcy ahead of time. Pt made aware of need to schedule apt for AE ASAP.

## 2018-05-14 NOTE — Telephone Encounter (Signed)
error 

## 2018-06-18 ENCOUNTER — Other Ambulatory Visit: Payer: Self-pay | Admitting: Obstetrics and Gynecology

## 2018-06-18 DIAGNOSIS — Z3041 Encounter for surveillance of contraceptive pills: Secondary | ICD-10-CM

## 2018-06-19 ENCOUNTER — Other Ambulatory Visit: Payer: Self-pay

## 2018-06-19 DIAGNOSIS — Z3041 Encounter for surveillance of contraceptive pills: Secondary | ICD-10-CM

## 2018-06-19 MED ORDER — NORETHIN ACE-ETH ESTRAD-FE 1-20 MG-MCG PO TABS: 1.0000 | ORAL_TABLET | Freq: Every day | ORAL | 0 refills | Status: DC

## 2018-07-01 ENCOUNTER — Encounter: Payer: Self-pay | Admitting: Obstetrics and Gynecology

## 2018-07-01 ENCOUNTER — Ambulatory Visit (INDEPENDENT_AMBULATORY_CARE_PROVIDER_SITE_OTHER): Payer: Medicaid Other | Admitting: Obstetrics and Gynecology

## 2018-07-01 ENCOUNTER — Other Ambulatory Visit (HOSPITAL_COMMUNITY)
Admission: RE | Admit: 2018-07-01 | Discharge: 2018-07-01 | Disposition: A | Discharge status: Home / Self Care | Payer: Medicaid Other | Source: Ambulatory Visit | Attending: Obstetrics and Gynecology | Admitting: Obstetrics and Gynecology

## 2018-07-01 VITALS — BP 118/76 | HR 72 | Ht 69.0 in | Wt 214.5 lb

## 2018-07-01 DIAGNOSIS — Z0000 Encounter for general adult medical examination without abnormal findings: Secondary | ICD-10-CM

## 2018-07-01 DIAGNOSIS — Z113 Encounter for screening for infections with a predominantly sexual mode of transmission: Secondary | ICD-10-CM | POA: Insufficient documentation

## 2018-07-01 DIAGNOSIS — Z124 Encounter for screening for malignant neoplasm of cervix: Secondary | ICD-10-CM

## 2018-07-01 DIAGNOSIS — Z01419 Encounter for gynecological examination (general) (routine) without abnormal findings: Secondary | ICD-10-CM

## 2018-07-01 DIAGNOSIS — Z3041 Encounter for surveillance of contraceptive pills: Secondary | ICD-10-CM | POA: Diagnosis not present

## 2018-07-01 MED ORDER — NORETHIN ACE-ETH ESTRAD-FE 1-20 MG-MCG PO TABS: 1.0000 | ORAL_TABLET | Freq: Every day | ORAL | 3 refills | Status: DC

## 2018-07-01 NOTE — Patient Instructions (Signed)
I value your feedback and entrusting us with your care. If you get a [facility] patient survey, I would appreciate you taking the time to let us know about your experience today. Thank you! 

## 2018-07-01 NOTE — Progress Notes (Signed)
Chief Complaint  Patient presents with  . Gynecologic Exam     HPI:      [name] is a 27 y.o. G1P1001 who LMP was Patient's last menstrual period was 06/20/2018 (exact date)., presents today for her annual examination.  Her menses are regular every 28-30 days, lasting 7 days.  Dysmenorrhea mod, not relieved with NSAIDs. No activities missed. BTB resolved last yr with OCP change.   Sex activity: single partner, contraception - OCP (estrogen/progesterone).  Last Pap: 05/05/17 Results: ASCUS with neg HPV DNA; due for repeat pap today. Hx of STDs: none  There is no FH of breast cancer. There is no FH of ovarian cancer. The patient does not do self-breast exams.  Tobacco use: The patient currently smokes 1/2 packs of cigarettes per day for the past many years. Alcohol use: none Exercise: moderately active  She does get adequate calcium and Vitamin D in her diet.    History reviewed. No pertinent past medical history.  Past Surgical History:  Procedure Laterality Date  . NO PAST SURGERIES      Family History  Problem Relation Age of Onset  . Diabetes Father   . Diabetes Maternal Grandmother     Social History   Socioeconomic History  . Marital status: Single    Spouse name: Not on file  . Number of children: Not on file  . Years of education: Not on file  . Highest education level: Not on file  Occupational History  . Not on file  Social Needs  . Financial resource strain: Not on file  . Food insecurity:    Worry: Not on file    Inability: Not on file  . Transportation needs:    Medical: Not on file    Non-medical: Not on file  Tobacco Use  . Smoking status: Current Every Day Smoker    Packs/day: 0.50  . Smokeless tobacco: Never Used  Substance and Sexual Activity  . Alcohol use: Yes    Comment: LIGHT  . Drug use: No  . Sexual activity: Yes    Birth control/protection: Condom, Pill  Lifestyle  . Physical activity:    Days per week: Not on file     Minutes per session: Not on file  . Stress: Not on file  Relationships  . Social connections:    Talks on phone: Not on file    Gets together: Not on file    Attends religious service: Not on file    Active member of club or organization: Not on file    Attends meetings of clubs or organizations: Not on file    Relationship status: Not on file  . Intimate partner violence:    Fear of current or ex partner: Not on file    Emotionally abused: Not on file    Physically abused: Not on file    Forced sexual activity: Not on file  Other Topics Concern  . Not on file  Social History Narrative  . Not on file     Current Outpatient Medications:  .  norethindrone-ethinyl estradiol (JUNEL FE 1/20) 1-20 MG-MCG tablet, Take 1 tablet by mouth daily., Disp: 84 tablet, Rfl: 3  ROS:  Review of Systems  Constitutional: Negative for fatigue, fever and unexpected weight change.  Respiratory: Negative for cough, shortness of breath and wheezing.   Cardiovascular: Negative for chest pain, palpitations and leg swelling.  Gastrointestinal: Negative for blood in stool, constipation, diarrhea, nausea and vomiting.  Endocrine: Negative for  cold intolerance, heat intolerance and polyuria.  Genitourinary: Positive for vaginal bleeding. Negative for dyspareunia, dysuria, flank pain, frequency, genital sores, hematuria, menstrual problem, pelvic pain, urgency, vaginal discharge and vaginal pain.  Musculoskeletal: Negative for back pain, joint swelling and myalgias.  Skin: Negative for rash.  Neurological: Negative for dizziness, syncope, light-headedness, numbness and headaches.  Hematological: Negative for adenopathy.  Psychiatric/Behavioral: Negative for agitation, confusion, sleep disturbance and suicidal ideas. The patient is not nervous/anxious.      Objective: BP 118/76   Pulse 72   Ht 5\' 9"  (1.753 m)   Wt 214 lb 8 oz (97.3 kg)   LMP 06/20/2018 (Exact Date)   BMI 31.68 kg/m    Physical  Exam  Constitutional: She is oriented to person, place, and time. She appears well-developed and well-nourished.  Genitourinary: Vagina normal and uterus normal. There is no rash or tenderness on the right labia. There is no rash or tenderness on the left labia. No erythema or tenderness in the vagina. No vaginal discharge found. Right adnexum does not display mass and does not display tenderness. Left adnexum does not display mass and does not display tenderness. Cervix does not exhibit motion tenderness or polyp. Uterus is not enlarged or tender.  Neck: Normal range of motion. No thyromegaly present.  Cardiovascular: Normal rate, regular rhythm and normal heart sounds.  No murmur heard. Pulmonary/Chest: Effort normal and breath sounds normal. Right breast exhibits no mass, no nipple discharge, no skin change and no tenderness. Left breast exhibits no mass, no nipple discharge, no skin change and no tenderness.  Abdominal: Soft. There is no tenderness. There is no guarding.  Musculoskeletal: Normal range of motion.  Neurological: She is alert and oriented to person, place, and time. No cranial nerve deficit.  Psychiatric: She has a normal mood and affect. Her behavior is normal.  Vitals reviewed.   Assessment/Plan: Encounter for annual routine gynecological examination  Cervical cancer screening - Plan: Cytology - PAP  Screening for STD (sexually transmitted disease) - Plan: Cytology - PAP  Encounter for surveillance of contraceptive pills - OCP RF - Plan: norethindrone-ethinyl estradiol (JUNEL FE 1/20) 1-20 MG-MCG tablet  Meds ordered this encounter  Medications  . norethindrone-ethinyl estradiol (JUNEL FE 1/20) 1-20 MG-MCG tablet    Sig: Take 1 tablet by mouth daily.    Dispense:  84 tablet    Refill:  3    Order Specific Question:   Supervising Provider    Answer:   Nadara Mustard [[id number]]              GYN counsel use and side effects of OCP's, adequate intake of calcium and  vitamin D     F/U  Return in about 1 year (around 07/02/2019).  [name], PA-C 07/01/2018 8:39 PM

## 2018-07-06 LAB — CYTOLOGY - PAP
Chlamydia: NEGATIVE
Diagnosis: NEGATIVE
Neisseria Gonorrhea: NEGATIVE
Trichomonas: NEGATIVE

## 2019-06-29 ENCOUNTER — Other Ambulatory Visit: Payer: Self-pay

## 2019-06-29 ENCOUNTER — Ambulatory Visit (LOCAL_COMMUNITY_HEALTH_CENTER): Payer: Self-pay

## 2019-06-29 DIAGNOSIS — Z111 Encounter for screening for respiratory tuberculosis: Secondary | ICD-10-CM

## 2019-07-02 ENCOUNTER — Other Ambulatory Visit: Payer: Self-pay

## 2019-08-17 ENCOUNTER — Other Ambulatory Visit: Payer: Self-pay

## 2019-08-17 DIAGNOSIS — Z20822 Contact with and (suspected) exposure to covid-19: Secondary | ICD-10-CM

## 2019-08-19 LAB — NOVEL CORONAVIRUS, NAA: SARS-CoV-2, NAA: NOT DETECTED

## 2024-02-04 ENCOUNTER — Emergency Department
Admission: EM | Admit: 2024-02-04 | Discharge: 2024-02-04 | Disposition: A | Discharge status: Home / Self Care | Attending: Emergency Medicine | Admitting: Emergency Medicine

## 2024-02-04 ENCOUNTER — Other Ambulatory Visit: Payer: Self-pay

## 2024-02-04 DIAGNOSIS — R42 Dizziness and giddiness: Secondary | ICD-10-CM | POA: Insufficient documentation

## 2024-02-04 DIAGNOSIS — H538 Other visual disturbances: Secondary | ICD-10-CM | POA: Insufficient documentation

## 2024-02-04 DIAGNOSIS — Z720 Tobacco use: Secondary | ICD-10-CM | POA: Diagnosis not present

## 2024-02-04 DIAGNOSIS — R944 Abnormal results of kidney function studies: Secondary | ICD-10-CM | POA: Insufficient documentation

## 2024-02-04 DIAGNOSIS — R110 Nausea: Secondary | ICD-10-CM | POA: Insufficient documentation

## 2024-02-04 DIAGNOSIS — R1013 Epigastric pain: Secondary | ICD-10-CM | POA: Insufficient documentation

## 2024-02-04 DIAGNOSIS — R8289 Other abnormal findings on cytological and histological examination of urine: Secondary | ICD-10-CM | POA: Insufficient documentation

## 2024-02-04 LAB — TROPONIN I (HIGH SENSITIVITY): Troponin I (High Sensitivity): <2 ng/L (ref <18)

## 2024-02-04 LAB — COMPREHENSIVE METABOLIC PANEL WITH GFR
ALT: 14 U/L (ref 0–44)
AST: 17 U/L (ref 15–41)
Albumin: 4.4 g/dL (ref 3.5–5.0)
Alkaline Phosphatase: 45 U/L (ref 38–126)
Anion gap: 8 (ref 5–15)
BUN: 11 mg/dL (ref 6–20)
CO2: 25 mmol/L (ref 22–32)
Calcium: 9.1 mg/dL (ref 8.9–10.3)
Chloride: 105 mmol/L (ref 98–111)
Creatinine, Ser: 1.04 mg/dL — ABNORMAL HIGH (ref 0.44–1.00)
GFR, Estimated: >60 mL/min (ref >60)
Glucose, Bld: 79 mg/dL (ref 70–99)
Potassium: 4.1 mmol/L (ref 3.5–5.1)
Sodium: 138 mmol/L (ref 135–145)
Total Bilirubin: 1 mg/dL (ref 0.0–1.2)
Total Protein: 7.3 g/dL (ref 6.5–8.1)

## 2024-02-04 LAB — URINALYSIS, ROUTINE W REFLEX MICROSCOPIC
Bacteria, UA: NONE SEEN
Bilirubin Urine: NEGATIVE
Glucose, UA: NEGATIVE mg/dL
Hgb urine dipstick: NEGATIVE
Ketones, ur: NEGATIVE mg/dL
Nitrite: NEGATIVE
Protein, ur: NEGATIVE mg/dL
Specific Gravity, Urine: 1.018 (ref 1.005–1.030)
pH: 5 (ref 5.0–8.0)

## 2024-02-04 LAB — CBC
HCT: 44.6 % (ref 36.0–46.0)
Hemoglobin: 15.3 g/dL — ABNORMAL HIGH (ref 12.0–15.0)
MCH: 30.2 pg (ref 26.0–34.0)
MCHC: 34.3 g/dL (ref 30.0–36.0)
MCV: 88 fL (ref 80.0–100.0)
Platelets: 223 10*3/uL (ref 150–400)
RBC: 5.07 MIL/uL (ref 3.87–5.11)
RDW: 12.7 % (ref 11.5–15.5)
WBC: 8.1 10*3/uL (ref 4.0–10.5)
nRBC: 0 % (ref 0.0–0.2)

## 2024-02-04 LAB — LIPASE, BLOOD: Lipase: 38 U/L (ref 11–51)

## 2024-02-04 LAB — POC URINE PREG, ED: Preg Test, Ur: NEGATIVE

## 2024-02-04 MED ORDER — SODIUM CHLORIDE 0.9 % IV BOLUS
1000.0000 mL | Freq: Once | INTRAVENOUS | Status: AC
  Administered 2024-02-04: 1000 mL via INTRAVENOUS

## 2024-02-04 NOTE — Discharge Instructions (Addendum)

## 2024-02-04 NOTE — ED Triage Notes (Signed)
 Pt c/o light headed, weak, and dizzy x2 weeks. Pt endorses nausea. Pt reports dizziness is worse with movement, and states it feels like motion sickness.

## 2024-02-04 NOTE — ED Provider Notes (Signed)
 Erin Levine Provider Note    Event Date/Time   First MD Initiated Contact with Patient 02/04/24 1600     (approximate)   History   Dizziness   HPI  Erin Levine is a 33 y.o. female with PMH of tobacco use disorder presents for evaluation of dizziness.  Patient states this has been going on for couple weeks.  She reports that it starts with abdominal pain in the epigastric area which then makes her nauseous, then she begins to feel lightheaded and states that she feels like her vision goes in a tunnel.  She has some blurry vision with this as well.  She feels like she is about to pass out but states she has not actually lost consciousness.  Patient is concerned because this often happens when she is driving.  Symptoms eventually fully resolve on their own.  Patient's not found any pattern related to this.      Physical Exam   Triage Vital Signs: ED Triage Vitals  Encounter Vitals Group     BP 02/04/24 1431 132/89     Systolic BP Percentile --      Diastolic BP Percentile --      Pulse Rate 02/04/24 1431 71     Resp 02/04/24 1431 18     Temp 02/04/24 1431 98.3 F (36.8 C)     Temp src --      SpO2 02/04/24 1431 100 %     Weight 02/04/24 1434 160 lb (72.6 kg)     Height 02/04/24 1432 5\' 9"  (1.753 m)     Head Circumference --      Peak Flow --      Pain Score 02/04/24 1432 0     Pain Loc --      Pain Education --      Exclude from Growth Chart --     Most recent vital signs: Vitals:   02/04/24 1431  BP: 132/89  Pulse: 71  Resp: 18  Temp: 98.3 F (36.8 C)  SpO2: 100%   General: Awake, no distress.  CV:  Good peripheral perfusion.  RRR. Resp:  Normal effort.  PAB. Abd:  No distention.  Soft, tender to palpation in the epigastric area. Other:  No focal neurodeficits.  PERRL, EOM intact.  No ataxia.   ED Results / Procedures / Treatments   Labs (all labs ordered are listed, but only abnormal results are displayed) Labs Reviewed   URINALYSIS, ROUTINE W REFLEX MICROSCOPIC - Abnormal; Notable for the following components:      Result Value   Color, Urine YELLOW (*)    APPearance CLEAR (*)    Leukocytes,Ua TRACE (*)    All other components within normal limits  CBC - Abnormal; Notable for the following components:   Hemoglobin 15.3 (*)    All other components within normal limits  COMPREHENSIVE METABOLIC PANEL WITH GFR - Abnormal; Notable for the following components:   Creatinine, Ser 1.04 (*)    All other components within normal limits  LIPASE, BLOOD  POC URINE PREG, ED  TROPONIN I (HIGH SENSITIVITY)     EKG  ED Provider Interpretation: Sinus bradycardia but no other arrhythmia.  Vent. rate 51 BPM PR interval 128 ms QRS duration 90 ms QT/QTcB 464/427 ms P-R-T axes 73 62 56  PROCEDURES:  Critical Care performed: No  Procedures   MEDICATIONS ORDERED IN ED: Medications  sodium chloride 0.9 % bolus 1,000 mL (1,000 mLs Intravenous New Bag/Given 02/04/24 1714)  IMPRESSION / MDM / ASSESSMENT AND PLAN / ED COURSE  I reviewed the triage vital signs and the nursing notes.                             33 year old female presents for evaluation of dizziness.  Vital signs are stable patient NAD on exam.  Differential diagnosis includes, but is not limited to, cardiac arrhythmia, vasovagal reaction, orthostatic hypotension, hypoglycemia, psychogenic.  Patient's presentation is most consistent with acute complicated illness / injury requiring diagnostic workup.  CBC shows elevated hemoglobin otherwise unremarkable.  CMP shows slightly elevated creatinine otherwise normal.  Lipase WNL.  Urinalysis shows trace leukocytes otherwise normal.  Pregnancy test is negative.  Did offer CT of the abdomen given patient was tender in the epigastric area on exam however given normal blood work I felt this would be low yield.  Patient was okay with holding off at this point.  She has a history of GERD and is  currently taking omeprazole for this.  Patient's description of her symptoms sounds consistent with presyncope.  Will check a single troponin and EKG as well as orthostatic vital signs while in the emergency department.  Patient did not have positive orthostatic vital signs.  She did not become symptomatic during this process.    Work up is overall reassuring.  I explained to the patient there are multiple potential causes for her symptoms but with the workup we did today were able to rule out the more serious causes like arrhythmia, electrolyte abnormalities, hypoglycemia.  We discussed behavioral changes like making sure she stays well-hydrated and eats regularly.  We discussed that she may need to wear a cardiac monitor to watch for arrhythmias.  I explained that this can be coordinated by primary care provider.  Patient does not have a primary care provider so I will place a referral for one and provide her with follow-up options.  Patient was given a note for work.  She voiced understanding, all questions were answered and she was stable at discharge.  Clinical Course as of 02/04/24 1821  Wed Feb 04, 2024  1740 Troponin I (High Sensitivity) Normal. [LD]  1814 ED EKG Sinus bradycardia, no other arrhythmias. [LD]    Clinical Course User Index [LD] Phyliss Breen, PA-C     FINAL CLINICAL IMPRESSION(S) / ED DIAGNOSES   Final diagnoses:  Dizziness     Rx / DC Orders   ED Discharge Orders          Ordered    Ambulatory Referral to Primary Care (Establish Care)        02/04/24 1821             Note:  This document was prepared using Dragon voice recognition software and may include unintentional dictation errors.   Phyliss Breen, PA-C 02/04/24 1821    Shane Darling, MD 02/04/24 [phone]

## 2024-02-05 ENCOUNTER — Encounter: Payer: Self-pay | Admitting: Family Medicine

## 2024-02-05 ENCOUNTER — Ambulatory Visit (INDEPENDENT_AMBULATORY_CARE_PROVIDER_SITE_OTHER): Admitting: Family Medicine

## 2024-02-05 VITALS — BP 111/73 | HR 68 | Temp 98.0°F | Resp 16 | Ht 70.47 in | Wt 165.6 lb

## 2024-02-05 DIAGNOSIS — F419 Anxiety disorder, unspecified: Secondary | ICD-10-CM | POA: Diagnosis not present

## 2024-02-05 DIAGNOSIS — Z7689 Persons encountering health services in other specified circumstances: Secondary | ICD-10-CM

## 2024-02-05 DIAGNOSIS — K219 Gastro-esophageal reflux disease without esophagitis: Secondary | ICD-10-CM | POA: Diagnosis not present

## 2024-02-05 MED ORDER — SUCRALFATE 1 G PO TABS: 1.0000 g | ORAL_TABLET | Freq: Three times a day (TID) | ORAL | 0 refills | Status: DC

## 2024-02-05 MED ORDER — ESCITALOPRAM OXALATE 5 MG PO TABS: 5.0000 mg | ORAL_TABLET | Freq: Every day | ORAL | 0 refills | Status: DC

## 2024-02-05 NOTE — Patient Instructions (Signed)

## 2024-02-05 NOTE — Progress Notes (Signed)
 New Patient Office Visit  Subjective   Patient ID: Erin Levine, female    DOB: Nov 01, 1990  Age: 33 y.o. MRN: [id number]  CC:  Chief Complaint  Patient presents with   Establish Care    Pt. Her to establish care.    Abdominal Pain    Pt. C/o abdominal pain that started 2 years ago. Pt. Stats of a mild discomfort.    hospital f/u     Pt. Was seen at [facility] on 02/04/24 due to nausea and dizziness. Pt. Denies being dizzy.     HPI Mekesha Levine is a 33 year old female who presents to establish with [facility] at [facility].   CC: Patient here to establish care  Last PCP:  Specialists:  [facility] yesterday for dizziness and abdominal pain. Unremarkable work-up Reports feelings of hot and nausea with dizziness. She reports seeing spots sometime with blurred vision. Reports her heart feels like it beats fast.  Patient points to her epigastric region and states that it "feels like I have a hole right here." Reports that it feels like a burning sensation in middle of her abdomen and upper chest  Currently taking omeprazole 20mg  OTC  Current cigarette smoker     02/05/2024    2:43 PM  GAD 7 : Generalized Anxiety Score  Nervous, Anxious, on Edge 2  Control/stop worrying 2  Worry too much - different things 2  Trouble relaxing 1  Restless 0  Easily annoyed or irritable 0  Afraid - awful might happen 0  Total GAD 7 Score 7  Anxiety Difficulty Somewhat difficult       02/05/2024    2:43 PM  PHQ9 SCORE ONLY  PHQ-9 Total Score 8   Outpatient Encounter Medications as of 02/05/2024  Medication Sig   escitalopram  (LEXAPRO ) 5 MG tablet Take 1 tablet (5 mg total) by mouth daily.   sucralfate  (CARAFATE ) 1 g tablet Take 1 tablet (1 g total) by mouth 4 (four) times daily -  with meals and at bedtime.   norethindrone-ethinyl estradiol (JUNEL FE 1/20) 1-20 MG-MCG tablet Take 1 tablet by mouth daily. (Patient not taking: Reported on 02/05/2024)   No facility-administered  encounter medications on file as of 02/05/2024.    Patient Active Problem List   Diagnosis Date Noted   Tobacco use disorder, continuous 05/05/2017   History reviewed. No pertinent past medical history. Past Surgical History:  Procedure Laterality Date   NO PAST SURGERIES     Family History  Problem Relation Age of Onset   Diabetes Father    Diabetes Maternal Grandmother    Social History   Socioeconomic History   Marital status: Single    Spouse name: Not on file   Number of children: Not on file   Years of education: Not on file   Highest education level: Not on file  Occupational History   Not on file  Tobacco Use   Smoking status: Every Day    Current packs/day: 0.50    Types: Cigarettes   Smokeless tobacco: Never  Vaping Use   Vaping status: Never Used  Substance and Sexual Activity   Alcohol use: Yes    Comment: LIGHT   Drug use: No   Sexual activity: Yes    Birth control/protection: Condom, Pill  Other Topics Concern   Not on file  Social History Narrative   Not on file   Social Drivers of Health   Financial Resource Strain: Not on file  Food Insecurity:  Not on file  Transportation Needs: Not on file  Physical Activity: Not on file  Stress: Not on file  Social Connections: Not on file  Intimate Partner Violence: Not on file   Outpatient Medications Prior to Visit  Medication Sig Dispense Refill   norethindrone-ethinyl estradiol (JUNEL FE 1/20) 1-20 MG-MCG tablet Take 1 tablet by mouth daily. (Patient not taking: Reported on 02/05/2024) 84 tablet 3   No facility-administered medications prior to visit.   Allergies  Allergen Reactions   Amoxicillin Other (See Comments)   ROS: see HPI    Objective   Today's Vitals   02/05/24 1452  BP: 111/73  Pulse: 68  Resp: 16  Temp: 98 F (36.7 C)  TempSrc: Oral  Weight: 165 lb 9.6 oz (75.1 kg)  Height: 5' 10.47" (1.79 m)  PainSc: 0-No pain   Physical Exam Vitals reviewed.  Constitutional:       Appearance: Normal appearance. She is well-developed.  Cardiovascular:     Rate and Rhythm: Normal rate and regular rhythm.     Pulses: Normal pulses.     Heart sounds: Normal heart sounds.  Pulmonary:     Effort: Pulmonary effort is normal.     Breath sounds: Normal breath sounds.  Abdominal:     General: Abdomen is flat. There is no distension.     Tenderness: There is abdominal tenderness in the epigastric area. There is no guarding or rebound.  Musculoskeletal:     Right lower leg: No edema.     Left lower leg: No edema.  Neurological:     Mental Status: She is alert.  Psychiatric:        Mood and Affect: Mood normal.        Behavior: Behavior normal.      Assessment & Plan:   1. Encounter to establish care (Primary) Patient is a 33 year old female who presents today to establish care with primary care at [facility]. Reviewed the past medical history, family history, social history, surgical history, medications and allergies today- updates made as indicated. Patient has concerns today about various symptoms, similar to acid reflux.    2. Anxiety Patient presents with generalized symptoms- including overheating, nausea, lightheadedness, feelings of heart racing, vision changes and epigastric pain. GAD7 completed with score of 7. Denies issues with panic attacks, shortness of breath, difficulty breathing, palpitations, hyperventilation, and dizziness. PHQ9 completed with score of 8. Denies active or passive suicidal ideations.  Discussed common side effects, including GI side effects, insomnia, lethargy, and decreased libido (usually with higher doses). Discussed her symptoms can be present with anxiety. She is agreeable to trial SSRI medication at this time. Plan for  4-6 week follow-up. Rx sent to pharmacy on file.  - escitalopram  (LEXAPRO ) 5 MG tablet; Take 1 tablet (5 mg total) by mouth daily.  Dispense: 30 tablet; Refill: 0  3. Gastroesophageal reflux disease without  esophagitis Discussed increasing omeprazole form 20mg  to 40mg  daily. Rx sent for sucralfate  to take with meals and at bedtime. Discussed that this sensation may be due to increased anxiety. Will treat anxiety and see if this helps resolve her abdominal symptoms. Advised patient when to seek emergency care.  - sucralfate  (CARAFATE ) 1 g tablet; Take 1 tablet (1 g total) by mouth 4 (four) times daily -  with meals and at bedtime.  Dispense: 30 tablet; Refill: 0   Return in about 4 weeks (around 03/04/2024) for Mood f/u.   Wilhelmena Hanson, FNP

## 2024-02-17 ENCOUNTER — Other Ambulatory Visit: Payer: Self-pay | Admitting: Family Medicine

## 2024-02-17 DIAGNOSIS — K219 Gastro-esophageal reflux disease without esophagitis: Secondary | ICD-10-CM

## 2024-02-17 NOTE — Telephone Encounter (Signed)
 Last Fill: 02/05/24  Last OV: 02/05/24 Next OV: 03/04/24  Routing to provider for review/authorization.

## 2024-02-17 NOTE — Telephone Encounter (Unsigned)
 Copied from CRM [id number]. Topic: Clinical - Medication Refill >> Feb 17, 2024  1:20 PM Shardie S wrote: Medication: sucralfate  (CARAFATE ) 1 g tablet  Has the patient contacted their pharmacy? No (Agent: If no, request that the patient contact the pharmacy for the refill. If patient does not wish to contact the pharmacy document the reason why and proceed with request.) (Agent: If yes, when and what did the pharmacy advise?)  This is the patient's preferred pharmacy:  [facility] [address] ([address]), [place] - [address] [place]) [place] [address] Phone: [phone] Fax: [phone]  Is this the correct pharmacy for this prescription? Yes If no, delete pharmacy and type the correct one.   Has the prescription been filled recently? No  Is the patient out of the medication? Yes  Has the patient been seen for an appointment in the last year OR does the patient have an upcoming appointment? Yes  Can we respond through MyChart? No  Agent: Please be advised that Rx refills may take up to 3 business days. We ask that you follow-up with your pharmacy.

## 2024-02-18 MED ORDER — SUCRALFATE 1 G PO TABS: 1.0000 g | ORAL_TABLET | Freq: Three times a day (TID) | ORAL | 0 refills | Status: AC

## 2024-02-18 NOTE — Telephone Encounter (Signed)
 Copied from CRM [id number]. Topic: Clinical - Prescription Issue >> Feb 18, 2024  3:44 PM Essie A wrote: Reason for CRM: Patient is calling again about her prescription for sucralfate  (CARAFATE ) 1 g tablet.,  Please return her call to let her know the status of this prescription.  Phone is [phone].

## 2024-03-04 ENCOUNTER — Encounter: Payer: Self-pay | Admitting: Family Medicine

## 2024-03-04 ENCOUNTER — Ambulatory Visit (INDEPENDENT_AMBULATORY_CARE_PROVIDER_SITE_OTHER): Admitting: Family Medicine

## 2024-03-04 VITALS — BP 110/73 | HR 63 | Temp 98.2°F | Resp 20 | Ht 70.47 in | Wt 169.0 lb

## 2024-03-04 DIAGNOSIS — R251 Tremor, unspecified: Secondary | ICD-10-CM | POA: Diagnosis not present

## 2024-03-04 DIAGNOSIS — Z114 Encounter for screening for human immunodeficiency virus [HIV]: Secondary | ICD-10-CM

## 2024-03-04 DIAGNOSIS — Z1159 Encounter for screening for other viral diseases: Secondary | ICD-10-CM | POA: Diagnosis not present

## 2024-03-04 DIAGNOSIS — Z0000 Encounter for general adult medical examination without abnormal findings: Secondary | ICD-10-CM

## 2024-03-04 DIAGNOSIS — F419 Anxiety disorder, unspecified: Secondary | ICD-10-CM | POA: Diagnosis not present

## 2024-03-04 LAB — POCT GLYCOSYLATED HEMOGLOBIN (HGB A1C): Hemoglobin A1C: 5.1 % (ref 4.0–5.6)

## 2024-03-04 MED ORDER — ESCITALOPRAM OXALATE 5 MG PO TABS
5.0000 mg | ORAL_TABLET | Freq: Every day | ORAL | 3 refills | Status: AC
Start: 2024-03-04 — End: ?

## 2024-03-04 NOTE — Progress Notes (Signed)
 Established Patient Office Visit  Subjective  Patient ID: Erin Levine, female    DOB: 1991-06-09  Age: 33 y.o. MRN: [id number]  Chief Complaint  Patient presents with   Anxiety   Depression   ANXIETY: Erin Levine is a pleasant 33 year old female patient who presents for the medical management of anxiety.  Current medication regimen: Lexapro  5mg  daily  Counseling: has not tried in the past  Well controlled: "I guess it makes me feel better."  Denies SI/HI.      03/04/2024    8:53 AM 02/05/2024    2:43 PM  GAD 7 : Generalized Anxiety Score  Nervous, Anxious, on Edge 0 2  Control/stop worrying 1 2  Worry too much - different things 2 2  Trouble relaxing 0 1  Restless 0 0  Easily annoyed or irritable 1 0  Afraid - awful might happen 0 0  Total GAD 7 Score 4 7  Anxiety Difficulty Not difficult at all Somewhat difficult      03/04/2024    8:52 AM 02/05/2024    2:43 PM  PHQ9 SCORE ONLY  PHQ-9 Total Score 3 8   ROS: see HPI     Objective:      BP 110/73 (BP Location: Left Arm, Patient Position: Sitting, Cuff Size: Normal)   Pulse 63   Temp 98.2 F (36.8 C) (Oral)   Resp 20   Ht 5' 10.47" (1.79 m)   Wt 169 lb (76.7 kg)   LMP 01/10/2024 (Approximate)   SpO2 99%   BMI 23.93 kg/m  BP Readings from Last 3 Encounters:  03/04/24 110/73  02/05/24 111/73  02/04/24 110/73     Physical Exam Constitutional:      Appearance: Normal appearance.  Cardiovascular:     Rate and Rhythm: Normal rate and regular rhythm.     Pulses: Normal pulses.     Heart sounds: Normal heart sounds.  Pulmonary:     Effort: Pulmonary effort is normal.     Breath sounds: Normal breath sounds.  Neurological:     Mental Status: She is alert.  Psychiatric:        Mood and Affect: Mood normal.        Behavior: Behavior normal.       Assessment & Plan:   1. Anxiety (Primary) GAD7 completed with score of 4. Denies issues with panic attacks, shortness of breath, difficulty  breathing, palpitations, hyperventilation, and dizziness. PHQ9 completed with score of 3. Denies active or passive suicidal ideations. She reports her mood is well controlled on current dose with minimal medication side effects. She would like to continue with Lexapro  5mg  daily. Refill sent to pharmacy on file. Advised patient to reach out if her mood changes. Plan to repeat mood scores at physical.   - escitalopram  (LEXAPRO ) 5 MG tablet; Take 1 tablet (5 mg total) by mouth daily.  Dispense: 90 tablet; Refill: 3  2. Shakiness Initial OV, patient was seen for hospital f/u after going to [facility] for dizziness, nausea and shakiness. She reports that she does occasionally still experience shakiness. She does have a family history of diabetes. POCT hemoglobin A1c ordered today. Results of 5.1%. Advised patient to consume frequent/small meals, avoid fasting for long periods of time, and to stay hydrated. Discussed signs and symptoms of hypoglycemia. Will check fasting glucose with fasting labs for physical.  - POCT HgB A1C  3. Screening for HIV (human immunodeficiency virus) The [place]  Preventive Services Task Force (USPSTF) recommends HIV screening  for adults age 31-65. Patient agreeable to complete HIV screening and will update patient with results.  - HIV antibody (with reflex); Future  4. Need for hepatitis C screening test The [place]  Preventive Services Task Force (USPSTF) recommends hepatitis C screening for adults age 57-79. Patient agreeable to complete hepatitis C screening and will update patient with results.  - Hepatitis C Antibody; Future  5. Wellness examination Plan to complete fasting labs in near future.  - CBC with Differential/Platelet; Future - Comprehensive metabolic panel with GFR; Future - Lipid panel; Future - TSH Rfx on Abnormal to Free T4; Future   Return in about 8 weeks (around 04/29/2024) for Physical with fasting labs & Pap .    Wilhelmena Hanson, FNP

## 2024-03-04 NOTE — Patient Instructions (Signed)

## 2024-04-19 ENCOUNTER — Encounter (HOSPITAL_BASED_OUTPATIENT_CLINIC_OR_DEPARTMENT_OTHER): Payer: Self-pay

## 2024-04-27 ENCOUNTER — Ambulatory Visit

## 2024-05-04 ENCOUNTER — Encounter: Admitting: Family Medicine

## 2024-05-11 ENCOUNTER — Encounter: Admitting: Family Medicine
